# Patient Record
Sex: Female | Born: 1975 | Race: White | Hispanic: No | Marital: Single | Smoking: Current every day smoker
Health system: Southern US, Community
[De-identification: ages and names within clinical notes are randomized; demographics above are authoritative.]

## PROBLEM LIST (undated history)

## (undated) DIAGNOSIS — F419 Anxiety disorder, unspecified: Secondary | ICD-10-CM

## (undated) DIAGNOSIS — J449 Chronic obstructive pulmonary disease, unspecified: Secondary | ICD-10-CM

## (undated) HISTORY — PX: TUBAL LIGATION: SHX77

---

## 2017-07-23 ENCOUNTER — Emergency Department
Admission: EM | Admit: 2017-07-23 | Discharge: 2017-07-23 | Disposition: A | Discharge status: Home / Self Care | Payer: Self-pay | Attending: Emergency Medicine | Admitting: Emergency Medicine

## 2017-07-23 ENCOUNTER — Encounter: Payer: Self-pay | Admitting: Emergency Medicine

## 2017-07-23 ENCOUNTER — Other Ambulatory Visit: Payer: Self-pay

## 2017-07-23 DIAGNOSIS — T7840XA Allergy, unspecified, initial encounter: Secondary | ICD-10-CM | POA: Insufficient documentation

## 2017-07-23 DIAGNOSIS — F17210 Nicotine dependence, cigarettes, uncomplicated: Secondary | ICD-10-CM | POA: Insufficient documentation

## 2017-07-23 DIAGNOSIS — Z79899 Other long term (current) drug therapy: Secondary | ICD-10-CM | POA: Insufficient documentation

## 2017-07-23 HISTORY — DX: Anxiety disorder, unspecified: F41.9

## 2017-07-23 MED ORDER — SODIUM CHLORIDE 0.9 % IV BOLUS (SEPSIS)
1000.0000 mL | Freq: Once | INTRAVENOUS | Status: AC
  Administered 2017-07-23: 1000 mL via INTRAVENOUS

## 2017-07-23 MED ORDER — METHYLPREDNISOLONE SODIUM SUCC 125 MG IJ SOLR
125.0000 mg | Freq: Once | INTRAMUSCULAR | Status: AC
  Administered 2017-07-23: 125 mg via INTRAVENOUS
  Filled 2017-07-23: qty 2

## 2017-07-23 MED ORDER — FAMOTIDINE IN NACL 20-0.9 MG/50ML-% IV SOLN
20.0000 mg | Freq: Once | INTRAVENOUS | Status: AC
  Administered 2017-07-23: 20 mg via INTRAVENOUS
  Filled 2017-07-23: qty 50

## 2017-07-23 MED ORDER — EPINEPHRINE 0.3 MG/0.3ML IJ SOAJ
0.3000 mg | Freq: Once | INTRAMUSCULAR | Status: AC
  Administered 2017-07-23: 0.3 mg via INTRAMUSCULAR
  Filled 2017-07-23: qty 0.3

## 2017-07-23 MED ORDER — EPINEPHRINE 0.3 MG/0.3ML IJ SOAJ: 0.3000 mg | Freq: Once | INTRAMUSCULAR | 0 refills | Status: AC

## 2017-07-23 MED ORDER — ALPRAZOLAM 0.5 MG PO TABS
0.5000 mg | ORAL_TABLET | Freq: Once | ORAL | Status: AC
  Administered 2017-07-23: 0.5 mg via ORAL
  Filled 2017-07-23: qty 1

## 2017-07-23 MED ORDER — PREDNISONE 20 MG PO TABS: 60.0000 mg | ORAL_TABLET | Freq: Every day | ORAL | 0 refills | Status: AC

## 2017-07-23 NOTE — ED Provider Notes (Signed)
[facility] Emergency Department Provider Note ____________________________________________   First MD Initiated Contact with Patient 07/23/17 1936     (approximate)  I have reviewed the triage vital signs and the nursing notes.   HISTORY  Chief Complaint Allergic Reaction    HPI Elizabeth Watts is a 42 y.o. female with a past medical history of anxiety who presents with an apparent allergic reaction, acute onset approximately 2 hours ago, occurring while she was cooking, but caused by an unknown precipitant.  Patient reports one prior similar episode a few years ago which was also due to an unknown cause.  The patient states that the reaction is characterized by generalized itching, swelling of her lips, and hoarse voice.  The patient states that she feels like her throat is closing.  She states she took Benadryl at home but it did not improve the symptoms yet.  She reports mild shortness of breath but no wheezing.  No vomiting.   Past Medical History:  Diagnosis Date  . Anxiety     There are no active problems to display for this patient.   Past Surgical History:  Procedure Laterality Date  . TUBAL LIGATION      Prior to Admission medications   Medication Sig Start Date End Date Taking? Authorizing Provider  ALPRAZolam Prudy Feeler) 1 MG tablet Take 1 mg by mouth 3 (three) times daily as needed for anxiety.   Yes [provider]  temazepam (RESTORIL) 15 MG capsule Take 15 mg by mouth at bedtime.   Yes [provider]  EPINEPHrine 0.3 mg/0.3 mL IJ SOAJ injection Inject 0.3 mLs (0.3 mg total) into the muscle once for 1 dose. 07/23/17 07/23/17  Dionne Bucy, MD  predniSONE (DELTASONE) 20 MG tablet Take 3 tablets (60 mg total) by mouth daily with breakfast for 4 days. 07/24/17 07/28/17  Dionne Bucy, MD    Allergies Patient has no known allergies.  History reviewed. No pertinent family history.  Social History Social  History   Tobacco Use  . Smoking status: Current Every Day Smoker    Packs/day: 1.00    Types: Cigarettes  . Smokeless tobacco: Never Used  Substance Use Topics  . Alcohol use: No    Frequency: Never  . Drug use: No    Review of Systems  Constitutional: No fever. Eyes: No redness. ENT: Positive for throat discomfort. Cardiovascular: Denies chest pain. Respiratory: Positive for shortness of breath. Gastrointestinal: No vomiting.   Genitourinary: Negative for flank pain.  Musculoskeletal: Negative for back pain. Skin: Positive for itching. Neurological: Negative for headache.   ____________________________________________   PHYSICAL EXAM:  VITAL SIGNS: ED Triage Vitals  Enc Vitals Group     BP 07/23/17 1912 115/73     Pulse Rate 07/23/17 1912 (!) 117     Resp 07/23/17 1912 18     Temp 07/23/17 1912 98.3 F (36.8 C)     Temp Source 07/23/17 1912 Oral     SpO2 07/23/17 1912 100 %     Weight 07/23/17 1912 195 lb (88.5 kg)     Height 07/23/17 1912 5\' 6"  (1.676 m)     Head Circumference --      Peak Flow --      Pain Score 07/23/17 1922 0     Pain Loc --      Pain Edu? --      Excl. in GC? --     Constitutional: Alert and oriented.  Slightly anxious appearing but in no  acute distress. Eyes: Conjunctivae are normal.  Head: Atraumatic. Nose: No congestion/rhinnorhea. Mouth/Throat: Mucous membranes are moist.  Oropharynx clear with no visible swelling.  No stridor.  Possible trace swelling to lips. Neck: Normal range of motion.  Cardiovascular:  Good peripheral circulation. Respiratory: Normal respiratory effort.  No retractions. Lungs CTAB. Gastrointestinal: No distention.  Musculoskeletal: Extremities warm and well perfused.  Neurologic:  Normal speech and language. No gross focal neurologic deficits are appreciated.  Skin:  Skin is warm and dry.  Slightly erythematous.  No rash noted.  No hives. Psychiatric: Mood and affect are normal. Speech and behavior are  normal.  ____________________________________________   LABS (all labs ordered are listed, but only abnormal results are displayed)  Labs Reviewed - No data to display ____________________________________________  EKG   ____________________________________________  RADIOLOGY    ____________________________________________   PROCEDURES  Procedure(s) performed: No  Procedures  Critical Care performed: Yes  CRITICAL CARE Performed by: Dionne BucySebastian Dior Stepter   Total critical care time: 30 minutes  Critical care time was exclusive of separately billable procedures and treating other patients.  Critical care was necessary to treat or prevent imminent or life-threatening deterioration.  Critical care was time spent personally by me on the following activities: development of treatment plan with patient and/or surrogate as well as nursing, discussions with consultants, evaluation of patient's response to treatment, examination of patient, obtaining history from patient or surrogate, ordering and performing treatments and interventions, ordering and review of laboratory studies, ordering and review of radiographic studies, pulse oximetry and re-evaluation of patient's condition.  ____________________________________________   INITIAL IMPRESSION / ASSESSMENT AND PLAN / ED COURSE  Pertinent labs & imaging results that were available during my care of the patient were reviewed by me and considered in my medical decision making (see chart for details).  42 year old female with past medical history as noted above presents with an apparent allergic reaction, acute onset within the last few hours and caused by an unknown precipitant.  She has a history of a similar episode once in the past.  Past medical records reviewed in Epic and are noncontributory.  On exam, the patient is anxious but otherwise relatively well-appearing, vital signs are normal except for borderline tachycardia,  her skin appears slightly erythematous but with no focal hives or rash, her lips have trace swelling but the oropharynx is clear.  Presentation is most consistent with allergic reaction.  Given the presence of lip and throat symptoms, patient is appropriate for epinephrine as well as for additional antihistamine and Solu-Medrol.  We will give these, monitor the patient, and observe for approximately 4 hours.  ----------------------------------------- 10:18 PM on 07/23/2017 -----------------------------------------  Patient's symptoms have completely resolved except that she felt somewhat anxious after the epinephrine.  The patient declines to wait for the full 4 hours and states that she would like to go home.  At this time given that the symptoms had resolved and the patient appears reliable and demonstrates appropriate understanding of the seriousness of her allergic reaction and possible complications such as rebound reaction of the epinephrine wears off, she is appropriate for discharge home as she would like.  I gave her thorough return precautions and verbal as well as written discharge instructions, and she expressed understanding.     ____________________________________________   FINAL CLINICAL IMPRESSION(S) / ED DIAGNOSES  Final diagnoses:  Allergic reaction, initial encounter      NEW MEDICATIONS STARTED DURING THIS VISIT:  New Prescriptions   EPINEPHRINE 0.3 MG/0.3 ML IJ SOAJ  INJECTION    Inject 0.3 mLs (0.3 mg total) into the muscle once for 1 dose.   PREDNISONE (DELTASONE) 20 MG TABLET    Take 3 tablets (60 mg total) by mouth daily with breakfast for 4 days.     Note:  This document was prepared using Dragon voice recognition software and may include unintentional dictation errors.    Dionne Bucy, MD 07/23/17 2219

## 2017-07-23 NOTE — ED Notes (Signed)
Pt less red and states feels less swollen. States the epi caused her to feel like her heart is racing and requesting anxiety med.

## 2017-07-23 NOTE — ED Triage Notes (Addendum)
Pt presents with an allergic reaction to unknown agent; says she's itching all over, voice is hoarse, lips are swelling; face and arms are red; difficulty swallowing; pt took 2 Benadryl pta

## 2017-08-21 ENCOUNTER — Other Ambulatory Visit: Payer: Self-pay

## 2017-08-21 ENCOUNTER — Encounter: Payer: Self-pay | Admitting: Emergency Medicine

## 2017-08-21 ENCOUNTER — Emergency Department
Admission: EM | Admit: 2017-08-21 | Discharge: 2017-08-21 | Disposition: A | Discharge status: Home / Self Care | Payer: Self-pay | Attending: Emergency Medicine | Admitting: Emergency Medicine

## 2017-08-21 DIAGNOSIS — F17210 Nicotine dependence, cigarettes, uncomplicated: Secondary | ICD-10-CM | POA: Insufficient documentation

## 2017-08-21 DIAGNOSIS — Z4802 Encounter for removal of sutures: Secondary | ICD-10-CM | POA: Insufficient documentation

## 2017-08-21 DIAGNOSIS — Z79899 Other long term (current) drug therapy: Secondary | ICD-10-CM | POA: Insufficient documentation

## 2017-08-21 NOTE — ED Triage Notes (Signed)
Here for stitch removal.  Placed 10 days ago to right axilla at [facility].  Finished abx.  No noted complications.

## 2017-08-21 NOTE — ED Provider Notes (Signed)
[facility] Emergency Department Provider Note  ____________________________________________   First MD Initiated Contact with Patient 08/21/17 [phone]     (approximate)  I have reviewed the triage vital signs and the nursing notes.   HISTORY  Chief Complaint Suture / Staple Removal   HPI Elizabeth Watts is a 42 y.o. female is here for suture removal.  Patient states that approximately 10-12 days ago she had sutures placed in her right axilla.  She was placed on antibiotics and has finished those without any difficulty.  She denies any continued problems.  Past Medical History:  Diagnosis Date  . Anxiety     There are no active problems to display for this patient.   Past Surgical History:  Procedure Laterality Date  . TUBAL LIGATION      Prior to Admission medications   Medication Sig Start Date End Date Taking? Authorizing Provider  ALPRAZolam Prudy Feeler) 1 MG tablet Take 1 mg by mouth 3 (three) times daily as needed for anxiety.    [provider]  temazepam (RESTORIL) 15 MG capsule Take 15 mg by mouth at bedtime.    [provider]    Allergies Patient has no known allergies.  History reviewed. No pertinent family history.  Social History Social History   Tobacco Use  . Smoking status: Current Every Day Smoker    Packs/day: 1.00    Types: Cigarettes  . Smokeless tobacco: Never Used  Substance Use Topics  . Alcohol use: No    Frequency: Never  . Drug use: No    Review of Systems Constitutional: No fever/chills Cardiovascular: Denies chest pain. Respiratory: Denies shortness of breath. Skin: Positive for healing wound. Neurological: Negative for headaches ____________________________________________   PHYSICAL EXAM:  VITAL SIGNS: ED Triage Vitals  Enc Vitals Group     BP 08/21/17 0825 103/72     Pulse Rate 08/21/17 0823 100     Resp 08/21/17 0823 18     Temp 08/21/17 0823 97.8 F (36.6 C)     Temp Source  08/21/17 0823 Oral     SpO2 08/21/17 0823 97 %     Weight 08/21/17 0822 195 lb (88.5 kg)     Height 08/21/17 0822 5\' 6"  (1.676 m)     Head Circumference --      Peak Flow --      Pain Score 08/21/17 0821 5     Pain Loc --      Pain Edu? --      Excl. in GC? --    Constitutional: Alert and oriented. Well appearing and in no acute distress. Eyes: Conjunctivae are normal.  Head: Atraumatic. Nose: No congestion/rhinnorhea. Neck: No stridor.   Cardiovascular:   Good peripheral circulation. Respiratory: Normal respiratory effort.  No retractions. Lungs CTAB. Musculoskeletal: Moves upper and lower extremities without any difficulty.  Normal gait was noted. Neurologic:  Normal speech and language. No gross focal neurologic deficits are appreciated.  Skin:  Skin is warm, dry and intact.  Right axilla sutured area is healing without any signs of infection. Psychiatric: Mood and affect are normal. Speech and behavior are normal.  ____________________________________________   LABS (all labs ordered are listed, but only abnormal results are displayed)  Labs Reviewed - No data to display  PROCEDURES  Procedure(s) performed: None  Procedures  Critical Care performed: No  ____________________________________________   INITIAL IMPRESSION / ASSESSMENT AND PLAN / ED COURSE  Sutures were removed and no complications.  Patient is to follow-up  with her PCP if any continued problems.  ____________________________________________   FINAL CLINICAL IMPRESSION(S) / ED DIAGNOSES  Final diagnoses:  Encounter for removal of sutures     ED Discharge Orders    None       Note:  This document was prepared using Dragon voice recognition software and may include unintentional dictation errors.    Tommi RumpsSummers, Rhonda L, PA-C 08/21/17 [id number]    Jene EveryKinner, Robert, MD 08/21/17 1100

## 2017-08-21 NOTE — ED Notes (Signed)
See triage note  Presents for suture removal   Sutures intact and healing well  Some redness noted around edge  No drainage

## 2017-08-21 NOTE — Discharge Instructions (Addendum)
Continue to keep area clean and dry.  Watch for any signs of infection. Follow-up with your primary care provider if any continued problems.

## 2017-12-16 ENCOUNTER — Encounter: Payer: Self-pay | Admitting: Emergency Medicine

## 2017-12-16 ENCOUNTER — Other Ambulatory Visit: Payer: Self-pay

## 2017-12-16 ENCOUNTER — Emergency Department
Admission: EM | Admit: 2017-12-16 | Discharge: 2017-12-16 | Disposition: A | Discharge status: Home / Self Care | Payer: Self-pay | Attending: Emergency Medicine | Admitting: Emergency Medicine

## 2017-12-16 DIAGNOSIS — F17210 Nicotine dependence, cigarettes, uncomplicated: Secondary | ICD-10-CM | POA: Insufficient documentation

## 2017-12-16 DIAGNOSIS — S61519D Laceration without foreign body of unspecified wrist, subsequent encounter: Secondary | ICD-10-CM | POA: Insufficient documentation

## 2017-12-16 DIAGNOSIS — Z4802 Encounter for removal of sutures: Secondary | ICD-10-CM

## 2017-12-16 DIAGNOSIS — X58XXXD Exposure to other specified factors, subsequent encounter: Secondary | ICD-10-CM | POA: Insufficient documentation

## 2017-12-16 MED ORDER — MUPIROCIN 2 % EX OINT: 1.0000 "application " | TOPICAL_OINTMENT | Freq: Two times a day (BID) | CUTANEOUS | 0 refills | Status: AC

## 2017-12-16 NOTE — ED Triage Notes (Signed)
Here for possible suture removal.

## 2017-12-16 NOTE — ED Provider Notes (Signed)
[facility] Emergency Department Provider Note  ____________________________________________   First MD Initiated Contact with Patient 12/16/17 1004     (approximate)  I have reviewed the triage vital signs and the nursing notes.   HISTORY  Chief Complaint Suture / Staple Removal    HPI Chrystel Barefield is a 42 y.o. female presents emergency department for suture removal.  She had sutures placed in her wrist 10 days.  She states she has had no prominence of the area is a little red and suture line and she denies any pus or drainage from the area.   Past Medical History:  Diagnosis Date  . Anxiety     There are no active problems to display for this patient.   Past Surgical History:  Procedure Laterality Date  . TUBAL LIGATION      Prior to Admission medications   Medication Sig Start Date End Date Taking? Authorizing Provider  ALPRAZolam Prudy Feeler) 1 MG tablet Take 1 mg by mouth 3 (three) times daily as needed for anxiety.    [provider]  mupirocin ointment (BACTROBAN) 2 % Apply 1 application topically 2 (two) times daily. 12/16/17   Mariabella Nilsen, Roselyn Bering, PA-C  temazepam (RESTORIL) 15 MG capsule Take 15 mg by mouth at bedtime.    [provider]    Allergies Patient has no known allergies.  No family history on file.  Social History Social History   Tobacco Use  . Smoking status: Current Every Day Smoker    Packs/day: 1.00    Types: Cigarettes  . Smokeless tobacco: Never Used  Substance Use Topics  . Alcohol use: No    Frequency: Never  . Drug use: No    Review of Systems  Constitutional: No fever/chills Eyes: No visual changes. ENT: No sore throat. Respiratory: Denies cough Genitourinary: Negative for dysuria. Musculoskeletal: Negative for back pain. Skin: Negative for rash.  Here for suture removal    ____________________________________________   PHYSICAL EXAM:  VITAL SIGNS: ED Triage Vitals  Enc  Vitals Group     BP 12/16/17 0945 108/67     Pulse Rate 12/16/17 0945 96     Resp 12/16/17 0945 20     Temp 12/16/17 0945 97.7 F (36.5 C)     Temp Source 12/16/17 0945 Oral     SpO2 12/16/17 0945 100 %     Weight 12/16/17 0946 200 lb (90.7 kg)     Height 12/16/17 0946 5\' 6"  (1.676 m)     Head Circumference --      Peak Flow --      Pain Score 12/16/17 0946 4     Pain Loc --      Pain Edu? --      Excl. in GC? --     Constitutional: Alert and oriented. Well appearing and in no acute distress. Eyes: Conjunctivae are normal.  Head: Atraumatic. Nose: No congestion/rhinnorhea. Mouth/Throat: Mucous membranes are moist.   Neck:  supple no lymphadenopathy noted Cardiovascular: Normal rate, regular rhythm. Heart sounds are normal Respiratory: Normal respiratory effort.  No retractions, lungs c t a  Abd: soft nontender bs normal all 4 quad GU: deferred Musculoskeletal: FROM all extremities, warm and well perfused Neurologic:  Normal speech and language.  Skin:  Skin is warm, dry and intact.  Sutures are intact.  Prolene sutures have been used.  The area is red and swollen at the suture line.  There is no drainage, exudate, or increased warmth noted.  Psychiatric: Mood and affect are normal. Speech and behavior are normal.  ____________________________________________   LABS (all labs ordered are listed, but only abnormal results are displayed)  Labs Reviewed - No data to display ____________________________________________   ____________________________________________  RADIOLOGY    ____________________________________________   PROCEDURES  Procedure(s) performed: Sutures removed by nursing staff, I had to assist on 2 sutures that were embedded.  Procedures    ____________________________________________   INITIAL IMPRESSION / ASSESSMENT AND PLAN / ED COURSE  Pertinent labs & imaging results that were available during my care of the patient were reviewed by me  and considered in my medical decision making (see chart for details).   Patient is a [age] old female presents emergency department suture removal.  She had a laceration to the wrist 10 days ago.  She is here for suture removal.  She denies any issues with the laceration.  States there is some redness noted at the suture line.  On physical exam patient appears well.  She is afebrile.  The area is minimally tender to palpation.  There is no pus or drainage noted.  No increased warmth.  2 sutures are embedded.  I had to assist nursing staff with removal of the last 2 sutures.  All sutures are removed.  The wound is well approximated.  Patient tolerated procedure well.  She was given a prescription for Bactroban ointment.  She is to return to the emergency department if worsening.  She was discharged in stable condition.     As part of my medical decision making, I reviewed the following data within the electronic MEDICAL RECORD NUMBER Nursing notes reviewed and incorporated, Old chart reviewed, Notes from prior ED visits and [place] Controlled Substance Database  ____________________________________________   FINAL CLINICAL IMPRESSION(S) / ED DIAGNOSES  Final diagnoses:  Visit for suture removal      NEW MEDICATIONS STARTED DURING THIS VISIT:  New Prescriptions   MUPIROCIN OINTMENT (BACTROBAN) 2 %    Apply 1 application topically 2 (two) times daily.     Note:  This document was prepared using Dragon voice recognition software and may include unintentional dictation errors.    Faythe GheeFisher, Lashawna Poche W, PA-C 12/16/17 1027    Jeanmarie PlantMcShane, James A, MD 12/16/17 [phone]

## 2018-08-18 ENCOUNTER — Emergency Department
Admission: EM | Admit: 2018-08-18 | Discharge: 2018-08-18 | Disposition: A | Discharge status: Home / Self Care | Payer: Self-pay | Attending: Emergency Medicine | Admitting: Emergency Medicine

## 2018-08-18 ENCOUNTER — Encounter: Payer: Self-pay | Admitting: Emergency Medicine

## 2018-08-18 ENCOUNTER — Other Ambulatory Visit: Payer: Self-pay

## 2018-08-18 DIAGNOSIS — Z79899 Other long term (current) drug therapy: Secondary | ICD-10-CM | POA: Insufficient documentation

## 2018-08-18 DIAGNOSIS — F419 Anxiety disorder, unspecified: Secondary | ICD-10-CM | POA: Insufficient documentation

## 2018-08-18 DIAGNOSIS — S1016XA Insect bite (nonvenomous) of throat, initial encounter: Secondary | ICD-10-CM | POA: Insufficient documentation

## 2018-08-18 DIAGNOSIS — T7840XA Allergy, unspecified, initial encounter: Secondary | ICD-10-CM | POA: Insufficient documentation

## 2018-08-18 DIAGNOSIS — Y929 Unspecified place or not applicable: Secondary | ICD-10-CM | POA: Insufficient documentation

## 2018-08-18 DIAGNOSIS — J449 Chronic obstructive pulmonary disease, unspecified: Secondary | ICD-10-CM | POA: Insufficient documentation

## 2018-08-18 DIAGNOSIS — T782XXA Anaphylactic shock, unspecified, initial encounter: Secondary | ICD-10-CM | POA: Insufficient documentation

## 2018-08-18 DIAGNOSIS — F17210 Nicotine dependence, cigarettes, uncomplicated: Secondary | ICD-10-CM | POA: Insufficient documentation

## 2018-08-18 DIAGNOSIS — W57XXXA Bitten or stung by nonvenomous insect and other nonvenomous arthropods, initial encounter: Secondary | ICD-10-CM | POA: Insufficient documentation

## 2018-08-18 DIAGNOSIS — S30860A Insect bite (nonvenomous) of lower back and pelvis, initial encounter: Secondary | ICD-10-CM | POA: Insufficient documentation

## 2018-08-18 DIAGNOSIS — Y9389 Activity, other specified: Secondary | ICD-10-CM | POA: Insufficient documentation

## 2018-08-18 DIAGNOSIS — Y998 Other external cause status: Secondary | ICD-10-CM | POA: Insufficient documentation

## 2018-08-18 HISTORY — DX: Chronic obstructive pulmonary disease, unspecified: J44.9

## 2018-08-18 MED ORDER — PREDNISONE 20 MG PO TABS: 60.0000 mg | ORAL_TABLET | Freq: Every day | ORAL | 0 refills | Status: DC

## 2018-08-18 MED ORDER — EPINEPHRINE 0.3 MG/0.3ML IJ SOAJ: 0.3000 mg | Freq: Once | INTRAMUSCULAR | 0 refills | Status: AC

## 2018-08-18 MED ORDER — METHYLPREDNISOLONE SODIUM SUCC 125 MG IJ SOLR
125.0000 mg | Freq: Once | INTRAMUSCULAR | Status: AC
  Administered 2018-08-18: 125 mg via INTRAVENOUS
  Filled 2018-08-18: qty 2

## 2018-08-18 MED ORDER — FAMOTIDINE IN NACL 20-0.9 MG/50ML-% IV SOLN
20.0000 mg | Freq: Once | INTRAVENOUS | Status: AC
  Administered 2018-08-18: 20 mg via INTRAVENOUS
  Filled 2018-08-18: qty 50

## 2018-08-18 MED ORDER — EPINEPHRINE 0.3 MG/0.3ML IJ SOAJ
0.3000 mg | Freq: Once | INTRAMUSCULAR | Status: AC
  Administered 2018-08-18: 07:00:00 0.3 mg via INTRAMUSCULAR
  Filled 2018-08-18: qty 0.3

## 2018-08-18 NOTE — ED Provider Notes (Signed)
[facility] Emergency Department Provider Note  ____________________________________________   First MD Initiated Contact with Patient 08/18/18 [phone]     (approximate)  I have reviewed the triage vital signs and the nursing notes.   HISTORY  Chief Complaint Insect Bite and Allergic Reaction    HPI Elizabeth Watts is a 43 y.o. female with medical history as listed below which includes a prior allergic reaction to an unknown source.  She presents tonight by private vehicle for evaluation of insect bites.  She says that she had an allergic reaction insect bites in the past and that she needed epinephrine but that also made her feel anxious.  She is extremely anxious right now to be coming to the emergency department during the COVID-19 pandemic and she does not know whether the shortness of breath she is feeling is because of her anxiety or because of an allergic reaction.  Over the time that I was talking with her she changed her mind from saying that she wanted an EpiPen because she needed one in the past the same that she wanted to leave because she is worried about being here.  She is speaking in full sentences and having no apparent difficulty breathing but she says that she feels like she has having trouble breathing and that her throat is tight.   She denies nausea, vomiting, abdominal pain, fever/chills, diarrhea.  She has had similar symptoms in the past that she thinks might of been due to bug bites but another time she is not sure what happened.  She admits to anxiety.  The bug bites were new and are present on the left side of her neck as well as on her back.  They seem to have occurred overnight.         Past Medical History:  Diagnosis Date  . Anxiety   . COPD (chronic obstructive pulmonary disease) (HCC)     There are no active problems to display for this patient.   Past Surgical History:  Procedure Laterality Date  . TUBAL LIGATION       Prior to Admission medications   Medication Sig Start Date End Date Taking? Authorizing Provider  ALPRAZolam Prudy Feeler) 1 MG tablet Take 1 mg by mouth 3 (three) times daily as needed for anxiety.    [provider]  EPINEPHrine (EPIPEN 2-PAK) 0.3 mg/0.3 mL IJ SOAJ injection Inject 0.3 mLs (0.3 mg total) into the muscle once for 1 dose. Take for severe allergic reaction, then come immediately to the Emergency Department or call 911. 08/18/18 08/18/18  Loleta Rose, MD  mupirocin ointment (BACTROBAN) 2 % Apply 1 application topically 2 (two) times daily. 12/16/17   Fisher, Roselyn Bering, PA-C  predniSONE (DELTASONE) 20 MG tablet Take 3 tablets (60 mg total) by mouth daily. 08/18/18   Loleta Rose, MD  temazepam (RESTORIL) 15 MG capsule Take 15 mg by mouth at bedtime.    [provider]    Allergies Other  No family history on file.  Social History Social History   Tobacco Use  . Smoking status: Current Every Day Smoker    Packs/day: 1.00    Types: Cigarettes  . Smokeless tobacco: Never Used  Substance Use Topics  . Alcohol use: No    Frequency: Never  . Drug use: No    Review of Systems Constitutional: No fever/chills Eyes: No visual changes. ENT: No sore throat. Cardiovascular: Denies chest pain. Respiratory: Denies shortness of breath. Gastrointestinal: No abdominal pain.  No  nausea, no vomiting.  No diarrhea.  No constipation. Genitourinary: Negative for dysuria. Musculoskeletal: Negative for neck pain.  Negative for back pain. Integumentary: Negative for rash. Neurological: Negative for headaches, focal weakness or numbness.   ____________________________________________   PHYSICAL EXAM:  VITAL SIGNS: ED Triage Vitals  Enc Vitals Group     BP 08/18/18 0623 (!) 138/100     Pulse Rate 08/18/18 0623 (!) 116     Resp 08/18/18 0623 18     Temp 08/18/18 0623 (!) 97.5 F (36.4 C)     Temp Source 08/18/18 0623 Oral     SpO2 08/18/18 0623 100 %     Weight 08/18/18  0621 90.7 kg (200 lb)     Height 08/18/18 0621 1.702 m (5\' 7" )     Head Circumference --      Peak Flow --      Pain Score 08/18/18 0621 0     Pain Loc --      Pain Edu? --      Excl. in [place]? --     Constitutional: Alert and oriented. Well appearing and in no acute distress but obviously anxious. Eyes: Conjunctivae are normal.  Head: Atraumatic. Nose: No congestion/rhinnorhea. Mouth/Throat: Mucous membranes are moist.  Oropharynx non-erythematous.  No mucosal involvement.  No obvious pharyngeal swelling. Neck: No stridor.  No meningeal signs.  No brawny induration. Cardiovascular: Mild tachycardia, regular rhythm. Good peripheral circulation. Grossly normal heart sounds. Respiratory: Normal respiratory effort.  No retractions. Lungs CTAB. Gastrointestinal: Soft and nontender. No distention.  Musculoskeletal: No lower extremity tenderness nor edema. No gross deformities of extremities. Neurologic:  Normal speech and language. No gross focal neurologic deficits are appreciated.  Skin:  Skin is warm, dry and intact.  Patient has a line of what appear to be insect bites on the left anterior neck.  She does have a few scattered maculopapular lesions consistent with insect bites on her back.  There is no sign of scabies in the intertriginous regions.  There is no erythema or hives. Psychiatric: Mood and affect are normal. Speech and behavior are normal.  ____________________________________________   LABS (all labs ordered are listed, but only abnormal results are displayed)  Labs Reviewed - No data to display ____________________________________________  EKG  No indication for EKG ____________________________________________  RADIOLOGY   ED MD interpretation: No indication for imaging  Official radiology report(s): No results found.  ____________________________________________   PROCEDURES   Procedure(s) performed (including Critical Care):  .Critical Care Performed  by: Loleta Rose, MD Authorized by: Loleta Rose, MD   Critical care provider statement:    Critical care time (minutes):  30   Critical care time was exclusive of:  Separately billable procedures and treating other patients   Critical care was necessary to treat or prevent imminent or life-threatening deterioration of the following conditions: anaphylaxis.   Critical care was time spent personally by me on the following activities:  Development of treatment plan with patient or surrogate, discussions with consultants, evaluation of patient's response to treatment, examination of patient, obtaining history from patient or surrogate, ordering and performing treatments and interventions, ordering and review of laboratory studies, ordering and review of radiographic studies, pulse oximetry, re-evaluation of patient's condition and review of old charts     ____________________________________________   INITIAL IMPRESSION / MDM / ASSESSMENT AND PLAN / ED COURSE  As part of my medical decision making, I reviewed the following data within the electronic MEDICAL RECORD NUMBER Nursing notes reviewed and incorporated,  Old chart reviewed and Notes from prior ED visits  Elizabeth Watts was evaluated in Emergency Department on 08/18/2018 for the symptoms described in the history of present illness. She was evaluated in the context of the global COVID-19 pandemic, which necessitated consideration that the patient might be at risk for infection with the SARS-CoV-2 virus that causes COVID-19. Institutional protocols and algorithms that pertain to the evaluation of patients at risk for COVID-19 are in a state of rapid change based on information released by regulatory bodies including the CDC and federal and state organizations. These policies and algorithms were followed during the patient's care in the ED.      Differential diagnosis includes, but is not limited to, anaphylaxis, anxiety, nonspecific allergic  reaction.  The patient I had an extended discussion a couple of different times about how to proceed.  I explained to her that although clinically her exam is reassuring, I cannot feel when she is feeling in terms of her shortness of breath and throat closing.  She is speaking in full sentences, does not have a hoarse voice, and has no other signs of anaphylaxis, but she does have some new insect bites and seems to be reacting.  We discussed several different options but I recommended that given that anaphylaxis is by definition potentially life-threatening, we go ahead and treat empirically with epinephrine 0.3 mg intramuscular, Solu-Medrol 125 mg IV, famotidine 20 mg IV (and the patient already took Benadryl 50 mg p.o. at home).  She agreed.  I reminded her of the plan for at least 4 hours of observation afterwards to make sure there is no rebound effect.  She states she understands and agrees with the plan.  I will hold off on giving any antianxiety medications at this time.  No indication for lab work.  I will prescribe prednisone and EpiPen's for her after she goes home.  She understands the plan and I discussed the case with Dr. Lenard LancePaduchowski who is taking over during the day for reassessment and discharge when appropriate.     ____________________________________________  FINAL CLINICAL IMPRESSION(S) / ED DIAGNOSES  Final diagnoses:  Allergic reaction, initial encounter  Anaphylaxis, initial encounter  Insect bite of throat, initial encounter  Insect bite of lower back, initial encounter  Anxiety     MEDICATIONS GIVEN DURING THIS VISIT:  Medications  EPINEPHrine (EPI-PEN) injection 0.3 mg (0.3 mg Intramuscular Given 08/18/18 0702)  methylPREDNISolone sodium succinate (SOLU-MEDROL) 125 mg/2 mL injection 125 mg (125 mg Intravenous Given 08/18/18 0703)  famotidine (PEPCID) IVPB 20 mg premix (0 mg Intravenous Stopped 08/18/18 0735)     ED Discharge Orders         Ordered    EPINEPHrine  (EPIPEN 2-PAK) 0.3 mg/0.3 mL IJ SOAJ injection   Once     08/18/18 0637    predniSONE (DELTASONE) 20 MG tablet  Daily     08/18/18 [id number]           Note:  This document was prepared using Dragon voice recognition software and may include unintentional dictation errors.   Loleta RoseForbach, Kollin Udell, MD 08/18/18 [phone]

## 2018-08-18 NOTE — ED Triage Notes (Signed)
Patient states that she was bit by bed bugs this morning. Patient states that she has had an allergic reaction to bed bug bites in the past requiring epi. Patient states that she feels short of breath.

## 2018-08-18 NOTE — Discharge Instructions (Addendum)
You have been seen in the Emergency Department (ED) today for an allergic reaction.  You have been stable throughout your stay in the Emergency Department.  Please take your medications as prescribed and follow up with your doctor as indicated.  You should also take over-the-counter Benadryl around the clock for the next three days according to the dosing instructions on the package.  Please keep your Epi-Pen with you at all times and use it if experience shortness of breath or difficulty breathing or if you believe you are having a severe allergic reaction.  If you use the Epi-Pen, though, please call 911 afterwards or go immediately to your nearest Emergency Department.  Return to the Emergency Department (ED) if you experience any worsening or new symptoms that concern you.  Please call the number provided to arrange a follow-up appointment with an allergist as soon as possible for further evaluation and hopeful diagnosis of your allergies.

## 2018-08-18 NOTE — ED Provider Notes (Signed)
-----------------------------------------   9:50 AM on 08/18/2018 -----------------------------------------  Patient appears much better, has no signs of allergies at this time.  Patient is very anxious, she is very concerned about coronavirus and being in the hospital, is asking to be discharged soon as possible.  States she is been washing her hands and phone nonstop since she got here.  Patient is currently washing her hands in the sink when I went to evaluate her.  We will discharge the patient with allergist follow-up.  Patient agreeable to plan of care.   Minna Antis, MD 08/18/18 [phone]

## 2019-04-08 ENCOUNTER — Other Ambulatory Visit: Payer: Self-pay

## 2019-04-10 ENCOUNTER — Other Ambulatory Visit: Payer: Self-pay

## 2019-04-15 ENCOUNTER — Other Ambulatory Visit: Payer: Self-pay

## 2019-04-15 DIAGNOSIS — Z20822 Contact with and (suspected) exposure to covid-19: Secondary | ICD-10-CM

## 2019-04-17 LAB — NOVEL CORONAVIRUS, NAA: SARS-CoV-2, NAA: NOT DETECTED

## 2019-05-13 ENCOUNTER — Other Ambulatory Visit: Payer: Self-pay

## 2019-05-13 ENCOUNTER — Emergency Department: Payer: Self-pay

## 2019-05-13 ENCOUNTER — Emergency Department
Admission: EM | Admit: 2019-05-13 | Discharge: 2019-05-13 | Disposition: A | Discharge status: Home / Self Care | Payer: Self-pay | Attending: Emergency Medicine | Admitting: Emergency Medicine

## 2019-05-13 ENCOUNTER — Encounter: Payer: Self-pay | Admitting: Emergency Medicine

## 2019-05-13 DIAGNOSIS — F419 Anxiety disorder, unspecified: Secondary | ICD-10-CM | POA: Insufficient documentation

## 2019-05-13 DIAGNOSIS — Z20828 Contact with and (suspected) exposure to other viral communicable diseases: Secondary | ICD-10-CM | POA: Insufficient documentation

## 2019-05-13 DIAGNOSIS — F17210 Nicotine dependence, cigarettes, uncomplicated: Secondary | ICD-10-CM | POA: Insufficient documentation

## 2019-05-13 DIAGNOSIS — R5383 Other fatigue: Secondary | ICD-10-CM | POA: Insufficient documentation

## 2019-05-13 DIAGNOSIS — J449 Chronic obstructive pulmonary disease, unspecified: Secondary | ICD-10-CM | POA: Insufficient documentation

## 2019-05-13 LAB — BASIC METABOLIC PANEL
Anion gap: 10 (ref 5–15)
BUN: 11 mg/dL (ref 6–20)
CO2: 21 mmol/L — ABNORMAL LOW (ref 22–32)
Calcium: 8.9 mg/dL (ref 8.9–10.3)
Chloride: 107 mmol/L (ref 98–111)
Creatinine, Ser: 0.96 mg/dL (ref 0.44–1.00)
GFR calc Af Amer: >60 mL/min (ref >60)
GFR calc non Af Amer: >60 mL/min (ref >60)
Glucose, Bld: 104 mg/dL — ABNORMAL HIGH (ref 70–99)
Potassium: 4.3 mmol/L (ref 3.5–5.1)
Sodium: 138 mmol/L (ref 135–145)

## 2019-05-13 LAB — CBC
HCT: 41.5 % (ref 36.0–46.0)
Hemoglobin: 13.8 g/dL (ref 12.0–15.0)
MCH: 30.6 pg (ref 26.0–34.0)
MCHC: 33.3 g/dL (ref 30.0–36.0)
MCV: 92 fL (ref 80.0–100.0)
Platelets: 268 10*3/uL (ref 150–400)
RBC: 4.51 MIL/uL (ref 3.87–5.11)
RDW: 13.9 % (ref 11.5–15.5)
WBC: 14.8 10*3/uL — ABNORMAL HIGH (ref 4.0–10.5)
nRBC: 0 % (ref 0.0–0.2)

## 2019-05-13 LAB — POC SARS CORONAVIRUS 2 AG: SARS Coronavirus 2 Ag: NEGATIVE

## 2019-05-13 LAB — TROPONIN I (HIGH SENSITIVITY)
Troponin I (High Sensitivity): 3 ng/L (ref <18)
Troponin I (High Sensitivity): 3 ng/L (ref <18)

## 2019-05-13 MED ORDER — PROPRANOLOL HCL 20 MG PO TABS: 20.0000 mg | ORAL_TABLET | Freq: Three times a day (TID) | ORAL | 0 refills | Status: AC | PRN

## 2019-05-13 MED ORDER — HYDROXYZINE HCL 25 MG PO TABS: 25.0000 mg | ORAL_TABLET | Freq: Three times a day (TID) | ORAL | 0 refills | Status: AC | PRN

## 2019-05-13 NOTE — ED Provider Notes (Signed)
[facility] Emergency Department Provider Note  ____________________________________________  Time seen: Approximately 3:39 PM  I have reviewed the triage vital signs and the nursing notes.   HISTORY  Chief Complaint Irregular Heart Beat and Weakness    HPI Elizabeth Watts is a 43 y.o. female with a history of anxiety, COPD, and Xanax abuse who comes the ED complaining of fatigue for the past 2 days and fast heartbeat.  She feels like it is her anxiety.  She notes that she has not been on Xanax for many months and does not have insurance but is scheduled to have insurance coverage starting in January and hopes to start seeing primary care again.  She requests some Xanax to take until then.  She also requests a work note to be off tomorrow.  She denies chest pain or shortness of breath but does endorse some occasional coughing.  She notes that she works at the [employer] site.  No sick contacts.  She also notes that she tested positive for Covid about 5 weeks ago but had no symptoms.  Nobody else in her household developed symptoms or tested positive     Past Medical History:  Diagnosis Date  . Anxiety   . COPD (chronic obstructive pulmonary disease) (HCC)      There are no problems to display for this patient.    Past Surgical History:  Procedure Laterality Date  . TUBAL LIGATION       Prior to Admission medications   Medication Sig Start Date End Date Taking? Authorizing Provider  hydrOXYzine (ATARAX/VISTARIL) 25 MG tablet Take 1 tablet (25 mg total) by mouth 3 (three) times daily as needed for anxiety. 05/13/19   Sharman CheekStafford, Schneider Warchol, MD  mupirocin ointment (BACTROBAN) 2 % Apply 1 application topically 2 (two) times daily. 12/16/17   Fisher, Roselyn BeringSusan W, PA-C  propranolol (INDERAL) 20 MG tablet Take 1 tablet (20 mg total) by mouth 3 (three) times daily as needed (anxiety). 05/13/19   Sharman CheekStafford, Jermale Crass, MD  temazepam (RESTORIL) 15 MG capsule  Take 15 mg by mouth at bedtime.    [provider]     Allergies Other   No family history on file.  Social History Social History   Tobacco Use  . Smoking status: Current Every Day Smoker    Packs/day: 1.00    Types: Cigarettes  . Smokeless tobacco: Never Used  Substance Use Topics  . Alcohol use: No  . Drug use: No    Review of Systems  Constitutional:   No fever or chills.  Positive fatigue ENT:   No sore throat. No rhinorrhea. Cardiovascular:   No chest pain or syncope. Respiratory:   No dyspnea or cough. Gastrointestinal:   Negative for abdominal pain, vomiting and diarrhea.  Musculoskeletal:   Negative for focal pain or swelling All other systems reviewed and are negative except as documented above in ROS and HPI.  ____________________________________________   PHYSICAL EXAM:  VITAL SIGNS: ED Triage Vitals  Enc Vitals Group     BP 05/13/19 0956 117/80     Pulse Rate 05/13/19 0956 (!) 116     Resp 05/13/19 0956 18     Temp 05/13/19 0956 97.7 F (36.5 C)     Temp Source 05/13/19 0956 Oral     SpO2 05/13/19 0956 100 %     Weight 05/13/19 0954 200 lb (90.7 kg)     Height 05/13/19 0954 5\' 6"  (1.676 m)     Head Circumference --  Peak Flow --      Pain Score 05/13/19 0954 0     Pain Loc --      Pain Edu? --      Excl. in [place]? --     Vital signs reviewed, nursing assessments reviewed.   Constitutional:   Alert and oriented. Non-toxic appearance. Eyes:   Conjunctivae are normal. EOMI. PERRL. ENT      Head:   Normocephalic and atraumatic.      Nose:   Wearing a mask.      Mouth/Throat:   Wearing a mask.      Neck:   No meningismus. Full ROM. Hematological/Lymphatic/Immunilogical:   No cervical lymphadenopathy. Cardiovascular:   RRR, heart rate 90. Symmetric bilateral radial and DP pulses.  No murmurs. Cap refill less than 2 seconds. Respiratory:   Normal respiratory effort without tachypnea/retractions. Breath sounds are clear and equal  bilaterally. No wheezes/rales/rhonchi. Gastrointestinal:   Soft and nontender. Non distended. There is no CVA tenderness.  No rebound, rigidity, or guarding.  Musculoskeletal:   Normal range of motion in all extremities. No joint effusions.  No lower extremity tenderness.  No edema. Neurologic:   Normal speech and language.  Motor grossly intact. No acute focal neurologic deficits are appreciated.  Skin:    Skin is warm, dry and intact. No rash noted.  No petechiae, purpura, or bullae.  ____________________________________________    LABS (pertinent positives/negatives) (all labs ordered are listed, but only abnormal results are displayed) Labs Reviewed  BASIC METABOLIC PANEL - Abnormal; Notable for the following components:      Result Value   CO2 21 (*)    Glucose, Bld 104 (*)    All other components within normal limits  CBC - Abnormal; Notable for the following components:   WBC 14.8 (*)    All other components within normal limits  POC SARS CORONAVIRUS 2 AG -  ED  POC SARS CORONAVIRUS 2 AG  TROPONIN I (HIGH SENSITIVITY)  TROPONIN I (HIGH SENSITIVITY)   ____________________________________________   EKG  Interpreted by me Sinus tachycardia rate 122, right axis, normal intervals.  Normal QRS ST segments and T waves.  ____________________________________________    RADIOLOGY  DG Chest 2 View  Result Date: 05/13/2019 CLINICAL DATA:  Irregular heartbeat. Weakness. History of COPD. EXAM: CHEST - 2 VIEW COMPARISON:  None. FINDINGS: The cardiomediastinal silhouette is within normal limits. The lungs are well inflated without evidence of airspace consolidation, edema, pleural effusion, pneumothorax. No acute osseous abnormality is identified. IMPRESSION: No active cardiopulmonary disease. Electronically Signed   By: Logan Bores M.D.   On: 05/13/2019 10:17     ____________________________________________   PROCEDURES Procedures  ____________________________________________  DIFFERENTIAL DIAGNOSIS   Anxiety, viral illness, COVID-19, malingering  CLINICAL IMPRESSION / ASSESSMENT AND PLAN / ED COURSE  Medications ordered in the ED: Medications - No data to display  Pertinent labs & imaging results that were available during my care of the patient were reviewed by me and considered in my medical decision making (see chart for details).  Aviella Disbrow was evaluated in Emergency Department on 05/13/2019 for the symptoms described in the history of present illness. She was evaluated in the context of the global COVID-19 pandemic, which necessitated consideration that the patient might be at risk for infection with the SARS-CoV-2 virus that causes COVID-19. Institutional protocols and algorithms that pertain to the evaluation of patients at risk for COVID-19 are in a state of rapid change based on information released by regulatory  bodies including the CDC and federal and state organizations. These policies and algorithms were followed during the patient's care in the ED.   Patient is nontoxic, well-appearing, calm who complains of anxiety and fatigue.  Doubt hyperthyroidism, infection or sepsis, ACS, PE, dissection.  Not significantly dehydrated.  Labs and chest x-ray are reassuring.  Rapid Covid test negative.  Due to her past benzodiazepine use, I will recommend a trial of propranolol and Vistaril for her anxiety symptoms until she can follow-up with primary care.      ____________________________________________   FINAL CLINICAL IMPRESSION(S) / ED DIAGNOSES    Final diagnoses:  Fatigue, unspecified type  Anxiety     ED Discharge Orders         Ordered    propranolol (INDERAL) 20 MG tablet  3 times daily PRN     05/13/19 1534    hydrOXYzine (ATARAX/VISTARIL) 25 MG tablet  3 times daily PRN     05/13/19 1536           Portions of this note were generated with dragon dictation software. Dictation errors may occur despite best attempts at proofreading.   Sharman Cheek, MD 05/13/19 [phone]

## 2019-05-13 NOTE — ED Notes (Signed)
See triage note  Presents with "not feeling well"  States she woke up with some weakness and felt like her heart was racing  States she was COVID positive 04/13/2019  Afebrile on arrival

## 2019-05-13 NOTE — ED Notes (Signed)
Called at 1320  No answer

## 2019-05-13 NOTE — ED Provider Notes (Signed)
[facility] Emergency Department Provider Note  ____________________________________________   None    (approximate)   I have reviewed the triage vital signs and the nursing notes.   Patient has been triaged with a MSE exam performed by myself at a minimum. Based on symptoms and screening exam, patient may receive a more in-depth exam, labs, imaging as detailed below. Patients have been advised of this setting and exam type at the time of patient interview.    HISTORY  Chief Complaint Irregular Heart Beat and Weakness    HPI Elizabeth Watts is a 43 y.o. female that presents to the emergency department with a complaint of dizziness, weakness, palpitations this morning.  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  Patient has a history of anxiety and used to take Xanax but has not needed medication in several months.  She is unsure if anxiety is what precipitated her symptoms.  She did not feel that there was anything specific at the Covid site that may have triggered anxiety however.  Her boss thought that maybe she needed something to eat so he sent her inside for an orange juice.  She does still feel weak.  Her dizziness and palpitations have resolved.  Patient tested positive for Covid at the end of November but has recovered from this.  No fevers.  Patient will receive a medical screening exam as detailed below.  Based off of this exam, more in depth exam, labs, imaging will be performed as needed for complaint.  Patient care will be eventually transferred to another provider in the emergency department for final exam, diagnosis and disposition.    Past Medical History:  Diagnosis Date  . Anxiety   . COPD (chronic obstructive pulmonary disease) (HCC)     There are no problems to display for this patient.   Past Surgical History:  Procedure Laterality Date  . TUBAL LIGATION      Prior to Admission medications   Medication  Sig Start Date End Date Taking? Authorizing Provider  ALPRAZolam Duanne Moron) 1 MG tablet Take 1 mg by mouth 3 (three) times daily as needed for anxiety.    [provider]  mupirocin ointment (BACTROBAN) 2 % Apply 1 application topically 2 (two) times daily. 12/16/17   Fisher, Linden Dolin, PA-C  predniSONE (DELTASONE) 20 MG tablet Take 3 tablets (60 mg total) by mouth daily. 08/18/18   Hinda Kehr, MD  temazepam (RESTORIL) 15 MG capsule Take 15 mg by mouth at bedtime.    [provider]    Allergies Other  No family history on file.  Social History Social History   Tobacco Use  . Smoking status: Current Every Day Smoker    Packs/day: 1.00    Types: Cigarettes  . Smokeless tobacco: Never Used  Substance Use Topics  . Alcohol use: No  . Drug use: No    Review of Systems Constitutional: No fever Cardiovascular: No chest pain. Respiratory: No cough. No shortness of breath/difficulty breathing Gastroenterology: No abdominal pain Musculoskeletal: Negative for musculoskeletal pain Integumentary: Negative for rash. Neurological: No focal weakness nor numbness.   ____________________________________________   PHYSICAL EXAM:  VITAL SIGNS: ED Triage Vitals  Enc Vitals Group     BP 05/13/19 0956 117/80     Pulse Rate 05/13/19 0956 (!) 116     Resp 05/13/19 0956 18     Temp 05/13/19 0956 97.7 F (36.5 C)     Temp Source 05/13/19 0956 Oral  SpO2 05/13/19 0956 100 %     Weight 05/13/19 0954 200 lb (90.7 kg)     Height 05/13/19 0954 5\' 6"  (1.676 m)     Head Circumference --      Peak Flow --      Pain Score 05/13/19 0954 0     Pain Loc --      Pain Edu? --      Excl. in GC? --     Constitutional: Alert and oriented. Generally well appearing and in no acute distress. Eyes: Conjunctivae are normal.  Nose: No significant congestion/rhinnorhea. Mouth: No gross oropharyngeal edema.  Neck: No stridor.  No meningeal signs.   Cardiovascular: Grossly normal heart  sounds. Respiratory: Normal respiratory effort without significant tachypnea and no observed retractions.  Gastrointestinal: No significant visible abdominal wall findings.  Musculoskeletal: No gross deformities of extremities. Neurologic:  Normal speech and language. No gross focal neurologic deficits are appreciated.  Skin:  Skin is warm, dry and intact. No rash noted.    ____________________________________________   LABS (all labs ordered are listed, but only abnormal results are displayed)  Labs Reviewed  BASIC METABOLIC PANEL - Abnormal; Notable for the following components:      Result Value   CO2 21 (*)    Glucose, Bld 104 (*)    All other components within normal limits  CBC - Abnormal; Notable for the following components:   WBC 14.8 (*)    All other components within normal limits  TROPONIN I (HIGH SENSITIVITY)  TROPONIN I (HIGH SENSITIVITY)    ____________________________________________   RADIOLOGY 05/15/19, personally viewed and evaluated these images (plain radiographs) as part of my medical decision making, as well as reviewing the written report by the radiologist.  Official radiology report(s): DG Chest 2 View  Result Date: 05/13/2019 CLINICAL DATA:  Irregular heartbeat. Weakness. History of COPD. EXAM: CHEST - 2 VIEW COMPARISON:  None. FINDINGS: The cardiomediastinal silhouette is within normal limits. The lungs are well inflated without evidence of airspace consolidation, edema, pleural effusion, pneumothorax. No acute osseous abnormality is identified. IMPRESSION: No active cardiopulmonary disease. Electronically Signed   By: 05/15/2019 M.D.   On: 05/13/2019 10:17    ____________________________________________    INITIAL IMPRESSION / MDM / ASSESSMENT AND PLAN / ED COURSE     Clinical Impression: Lab work, EKG, chest x-ray in process.  Symptoms may be from anxiety.    Patient has been screened based based on their arrival complaint,  evaluated for an emergent condition, and at a minimum has received a medical screening exam.  At this time, patient will receive further work-up as determined by medical screening exam.  Patient care will eventually be transferred to another provider in the emergency department for final diagnosis and disposition.    ____________________________________________  Note:  This document was prepared using 05/15/2019 and may include unintentional dictation errors.   Conservation officer, historic buildings, PA-C 05/13/19 1452    05/15/19, MD 05/13/19 [phone]

## 2019-05-13 NOTE — Discharge Instructions (Addendum)
Your chest x-ray and lab test today were all okay. A rapid Covid test was negative as well, but if you develop fever or other symptoms of viral illness, you should quarantine at home and pursue further testing.  Please follow-up with primary care as soon as able to continue assessing your symptoms.  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  Propranolol and Vistaril may be helpful instead for managing your anxiety symptoms.

## 2019-05-13 NOTE — ED Triage Notes (Signed)
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 Denies pain or SOB

## 2020-03-05 IMAGING — CR DG CHEST 2V
2 series · 2 of 2 positions shown · non-contrast
Comparison: None.

CLINICAL DATA: Irregular heartbeat. Weakness. History of COPD.

EXAM:
CHEST - 2 VIEW

[chest pa]
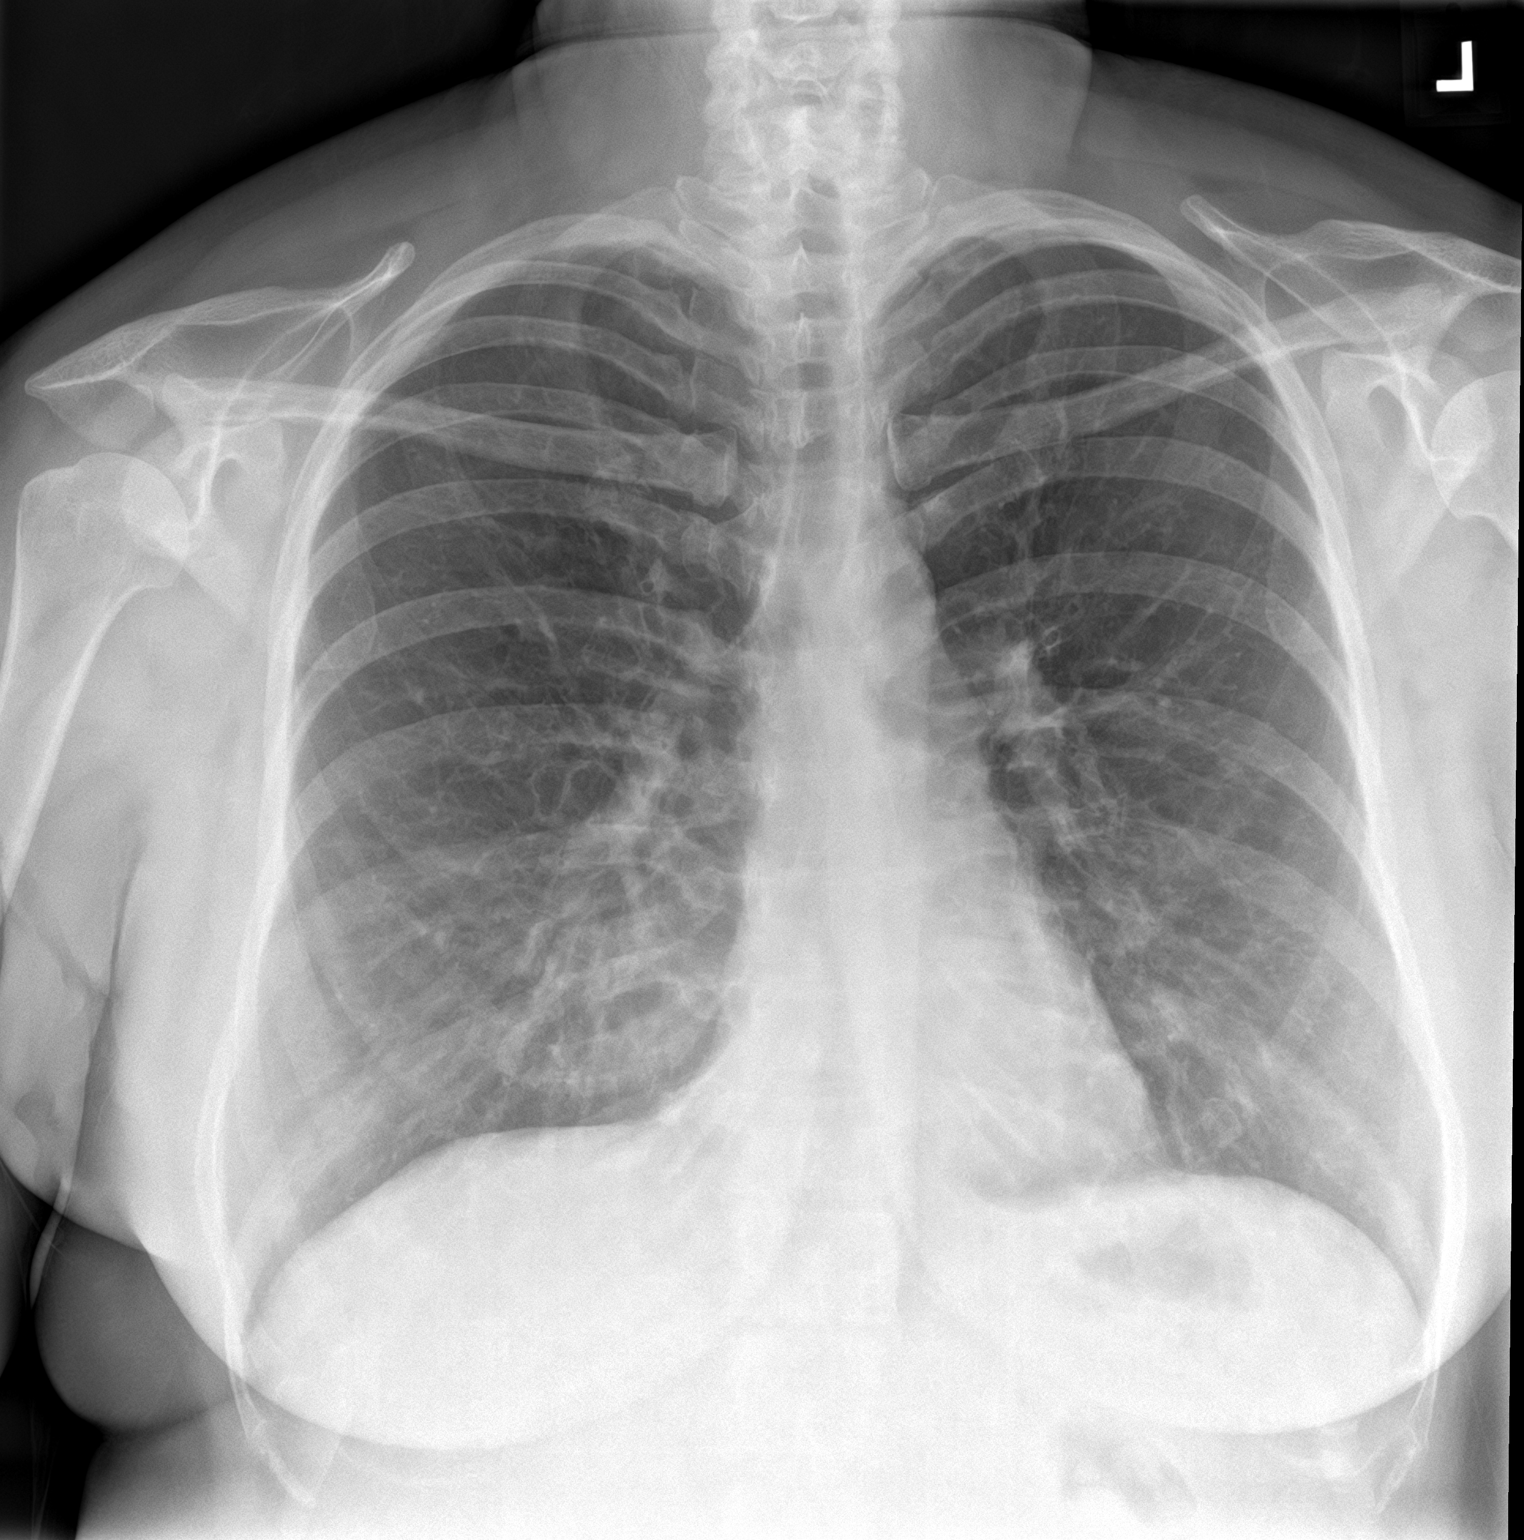

[chest lat]
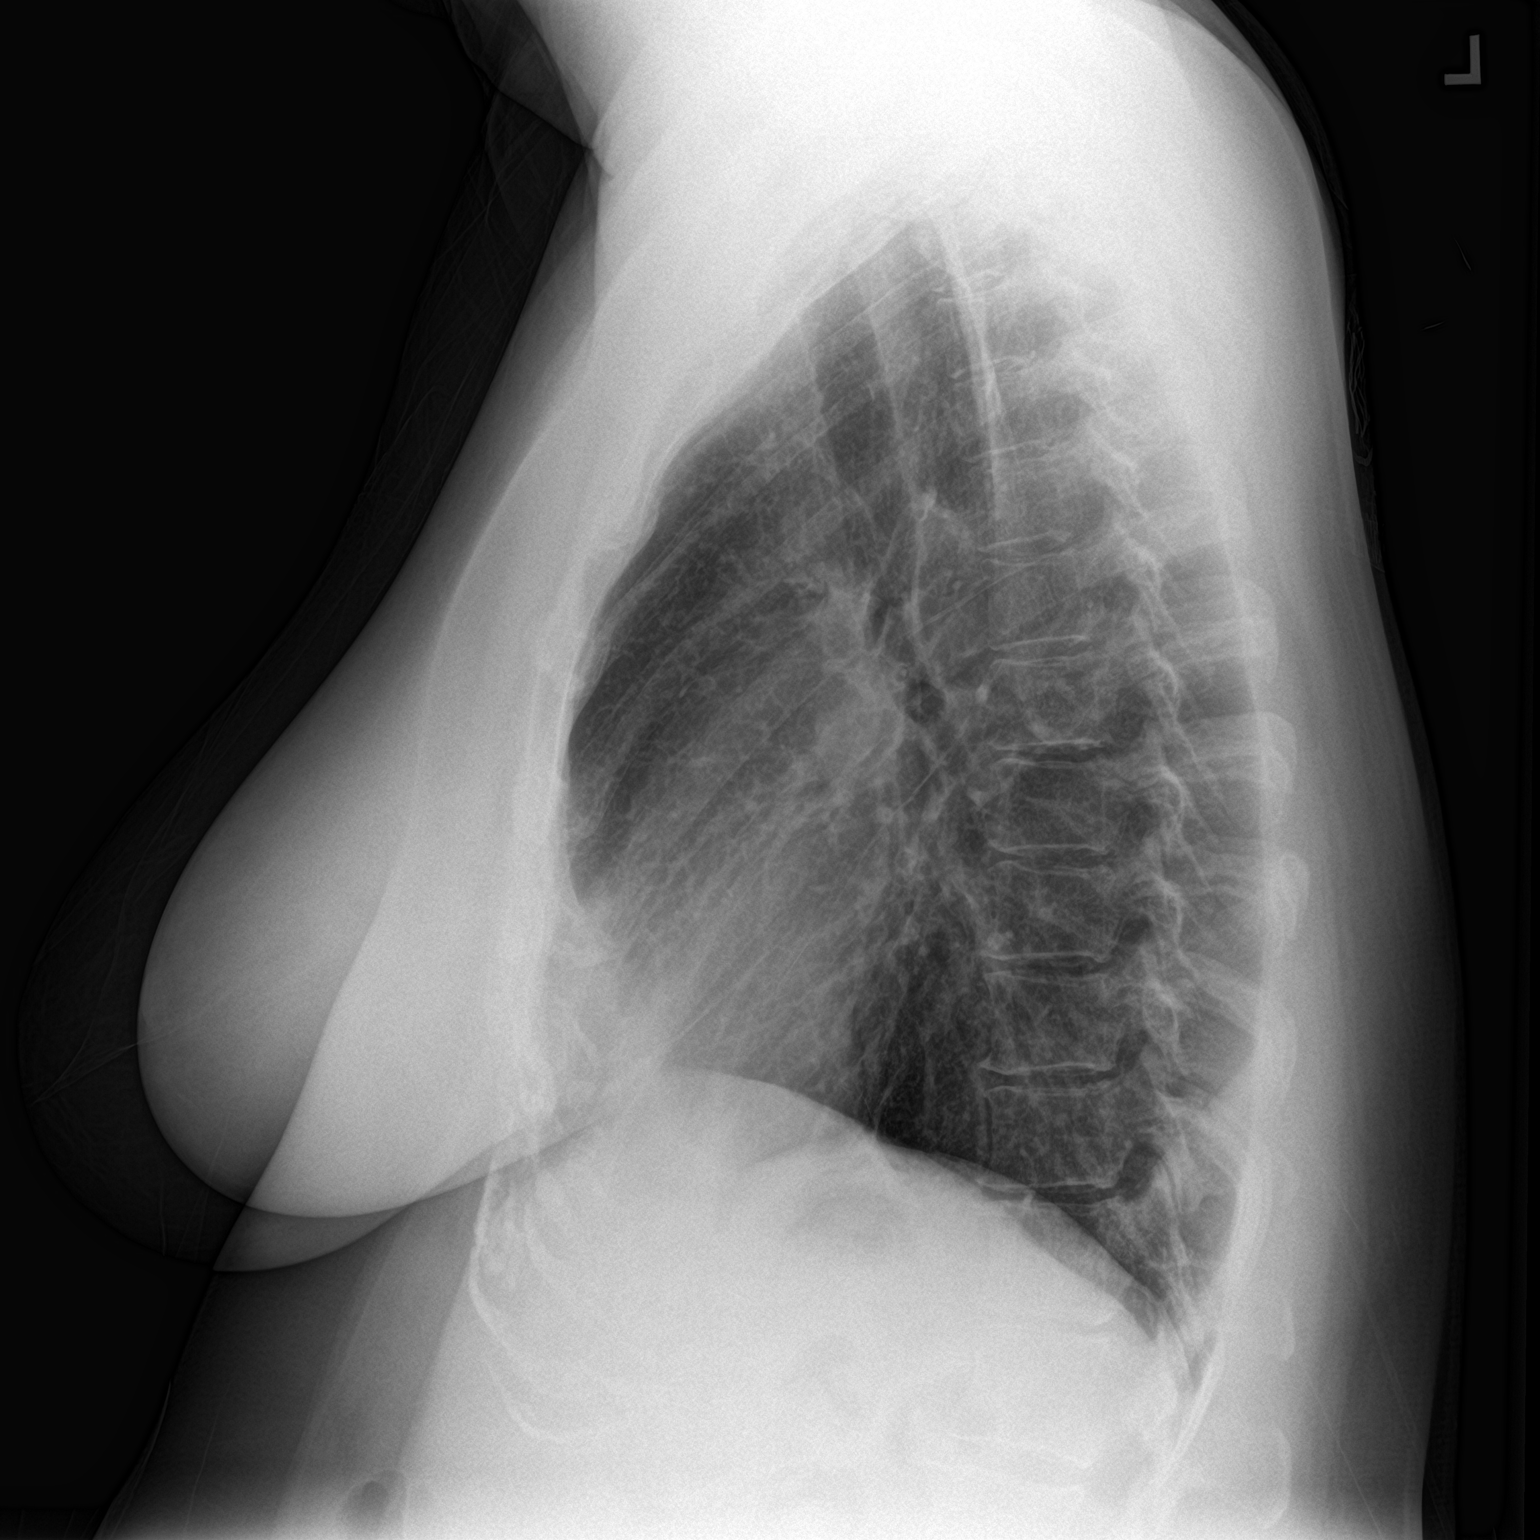

[2 of 2 positions shown; findings below may reference images not displayed]

FINDINGS: The cardiomediastinal silhouette is within normal limits. The lungs
are well inflated without evidence of airspace consolidation, edema,
pleural effusion, pneumothorax. No acute osseous abnormality is
identified.
IMPRESSION: No active cardiopulmonary disease.
